# Patient Record
Sex: Male | Born: 1980 | Race: White | Hispanic: No | Marital: Single | Smoking: Never smoker
Health system: Southern US, Community
[De-identification: ages and names within clinical notes are randomized; demographics above are authoritative.]

## PROBLEM LIST (undated history)

## (undated) HISTORY — PX: APPENDECTOMY: SHX54

## (undated) HISTORY — PX: DG ARTHRO THUMB*R*: HXRAD209

---

## 2015-09-16 ENCOUNTER — Ambulatory Visit
Admission: EM | Admit: 2015-09-16 | Discharge: 2015-09-16 | Disposition: A | Discharge status: Home / Self Care | Payer: Self-pay | Attending: Family Medicine | Admitting: Family Medicine

## 2015-09-16 DIAGNOSIS — Z0289 Encounter for other administrative examinations: Secondary | ICD-10-CM

## 2015-09-16 LAB — DEPT OF TRANSP DIPSTICK, URINE (ARMC ONLY)
Glucose, UA: NEGATIVE mg/dL
HGB URINE DIPSTICK: NEGATIVE
Protein, ur: NEGATIVE mg/dL
SPECIFIC GRAVITY, URINE: 1.015 (ref 1.005–1.030)

## 2015-09-16 NOTE — ED Provider Notes (Signed)
[facility]    CSN: [id number] Arrival date & time: 09/16/15  1036  First Provider Contact:  None       History   Chief Complaint Chief Complaint  Patient presents with  . Commercial Driver's License Exam    HPI Russell Lopez is a 35 y.o. male.    Patient here for DOT Physical (see scanned form)   The history is provided by the patient.    History reviewed. No pertinent past medical history.  There are no active problems to display for this patient.   Past Surgical History:  Procedure Laterality Date  . APPENDECTOMY    . DG ARTHRO THUMB*R*         Home Medications    Prior to Admission medications   Not on File    Family History Family History  Problem Relation Age of Onset  . Diabetes Father   . Kidney failure Father   . Diabetes Paternal Grandmother     Social History Social History  Substance Use Topics  . Smoking status: Never Smoker  . Smokeless tobacco: Current User    Types: Chew  . Alcohol use Yes     Comment: minimal     Allergies   Review of patient's allergies indicates no known allergies.   Review of Systems Review of Systems   Physical Exam Triage Vital Signs ED Triage Vitals  Enc Vitals Group     BP 09/16/15 1125 121/66     Pulse Rate 09/16/15 1125 74     Resp 09/16/15 1125 17     Temp 09/16/15 1125 98.2 F (36.8 C)     Temp Source 09/16/15 1125 Oral     SpO2 09/16/15 1125 96 %     Weight 09/16/15 1125 (!) 332 lb (150.6 kg)     Height 09/16/15 1125  (1.727 m)     Head Circumference --      Peak Flow --      Pain Score 09/16/15 1123 0     Pain Loc --      Pain Edu? --      Excl. in GC? --    No data found.   Updated Vital Signs BP 121/66 (BP Location: Left Arm)   Pulse 74   Temp 98.2 F (36.8 C) (Oral)   Resp 17   Ht  (1.727 m)   Wt (!) 332 lb (150.6 kg)   SpO2 96%   BMI 50.48 kg/m   Visual Acuity Right Eye Distance: 20/25 Left Eye Distance: 20/25 Bilateral  Distance: 20/20  Right Eye Near:   Left Eye Near:    Bilateral Near:     Physical Exam   UC Treatments / Results  Labs (all labs ordered are listed, but only abnormal results are displayed) Labs Reviewed  DEPT OF TRANSP DIPSTICK, URINE([facility] ONLY)    EKG  EKG Interpretation None       Radiology No results found.  Procedures Procedures (including critical care time)  Medications Ordered in UC Medications - No data to display   Initial Impression / Assessment and Plan / UC Course  I have reviewed the triage vital signs and the nursing notes.  Pertinent labs & imaging results that were available during my care of the patient were reviewed by me and considered in my medical decision making (see chart for details).  Clinical Course      Final Clinical Impressions(s) / UC Diagnoses   Final diagnoses:  Encounter for  examination required by Department of Transportation (DOT)    New Prescriptions There are no discharge medications for this patient.   DOT Physical (medically qualified for 2  year; see scanned form)   Payton Mccallum, MD 09/16/15 1327

## 2015-09-16 NOTE — ED Triage Notes (Signed)
Patient is here for DOT physical and verbalizes no other complaints today.

## 2017-05-07 ENCOUNTER — Ambulatory Visit (INDEPENDENT_AMBULATORY_CARE_PROVIDER_SITE_OTHER): Payer: BLUE CROSS/BLUE SHIELD

## 2017-05-07 ENCOUNTER — Ambulatory Visit
Admission: EM | Admit: 2017-05-07 | Discharge: 2017-05-07 | Disposition: A | Discharge status: Home / Self Care | Payer: BLUE CROSS/BLUE SHIELD | Attending: Family Medicine | Admitting: Family Medicine

## 2017-05-07 ENCOUNTER — Encounter: Payer: Self-pay | Admitting: *Deleted

## 2017-05-07 DIAGNOSIS — R1013 Epigastric pain: Secondary | ICD-10-CM | POA: Diagnosis not present

## 2017-05-07 DIAGNOSIS — K8050 Calculus of bile duct without cholangitis or cholecystitis without obstruction: Secondary | ICD-10-CM | POA: Diagnosis not present

## 2017-05-07 DIAGNOSIS — R1011 Right upper quadrant pain: Secondary | ICD-10-CM

## 2017-05-07 LAB — COMPREHENSIVE METABOLIC PANEL
ALK PHOS: 63 U/L (ref 38–126)
ALT: 45 U/L (ref 17–63)
AST: 29 U/L (ref 15–41)
Albumin: 4.3 g/dL (ref 3.5–5.0)
Anion gap: 9 (ref 5–15)
BUN: 10 mg/dL (ref 6–20)
CALCIUM: 9.1 mg/dL (ref 8.9–10.3)
CHLORIDE: 103 mmol/L (ref 101–111)
CO2: 26 mmol/L (ref 22–32)
CREATININE: 0.88 mg/dL (ref 0.61–1.24)
GFR calc non Af Amer: >60 mL/min (ref >60)
GLUCOSE: 96 mg/dL (ref 65–99)
Potassium: 4.4 mmol/L (ref 3.5–5.1)
SODIUM: 138 mmol/L (ref 135–145)
Total Bilirubin: 0.6 mg/dL (ref 0.3–1.2)
Total Protein: 7.5 g/dL (ref 6.5–8.1)

## 2017-05-07 LAB — LIPASE, BLOOD: Lipase: 20 U/L (ref 11–51)

## 2017-05-07 NOTE — ED Triage Notes (Signed)
Diffuse abd pain with nausea following meals x3 weeks.

## 2017-05-07 NOTE — ED Provider Notes (Signed)
[facility]    CSN: [id number] Arrival date & time: 05/07/17  1659     History   Chief Complaint Chief Complaint  Patient presents with  . Abdominal Pain  . Nausea    HPI Russell Lopez is a 37 y.o. male.   The history is provided by the patient.  Abdominal Pain  Pain location:  Epigastric and RUQ Pain quality: aching   Pain radiates to:  Does not radiate Pain severity:  Moderate Onset quality:  Sudden Duration:  3 weeks Timing:  Intermittent Progression:  Waxing and waning Chronicity:  New Context: eating (after eating, especially fatty meals)   Relieved by:  None tried Ineffective treatments:  None tried Associated symptoms: no anorexia, no belching, no chest pain, no chills, no constipation, no cough, no diarrhea, no dysuria, no fatigue, no fever, no flatus, no hematemesis, no hematochezia, no hematuria, no melena, no nausea, no shortness of breath, no sore throat, no vaginal bleeding, no vaginal discharge and no vomiting   Risk factors: obesity   Risk factors: no alcohol abuse     History reviewed. No pertinent past medical history.  There are no active problems to display for this patient.   Past Surgical History:  Procedure Laterality Date  . APPENDECTOMY    . DG ARTHRO THUMB*R*         Home Medications    Prior to Admission medications   Not on File    Family History Family History  Problem Relation Age of Onset  . Healthy Mother   . Diabetes Father   . Kidney failure Father   . Diabetes Paternal Grandmother     Social History Social History   Tobacco Use  . Smoking status: Never Smoker  . Smokeless tobacco: Current User    Types: Chew  Substance Use Topics  . Alcohol use: Yes    Comment: minimal  . Drug use: No     Allergies   Patient has no known allergies.   Review of Systems Review of Systems  Constitutional: Negative for chills, fatigue and fever.  HENT: Negative for sore throat.   Respiratory: Negative  for cough and shortness of breath.   Cardiovascular: Negative for chest pain.  Gastrointestinal: Positive for abdominal pain. Negative for anorexia, constipation, diarrhea, flatus, hematemesis, hematochezia, melena, nausea and vomiting.  Genitourinary: Negative for dysuria, hematuria, vaginal bleeding and vaginal discharge.     Physical Exam Triage Vital Signs ED Triage Vitals  Enc Vitals Group     BP 05/07/17 1804 125/75     Pulse Rate 05/07/17 1804 (!) 57     Resp 05/07/17 1804 16     Temp 05/07/17 1804 98.8 F (37.1 C)     Temp Source 05/07/17 1804 Oral     SpO2 05/07/17 1804 99 %     Weight 05/07/17 1806 300 lb (136.1 kg)     Height 05/07/17 1806 5\' 9"  (1.753 m)     Head Circumference --      Peak Flow --      Pain Score 05/07/17 1806 7     Pain Loc --      Pain Edu? --      Excl. in GC? --    No data found.  Updated Vital Signs BP 125/75 (BP Location: Left Arm)   Pulse (!) 57   Temp 98.8 F (37.1 C) (Oral)   Resp 16   Ht 5\' 9"  (1.753 m)   Wt 300 lb (136.1 kg)  SpO2 99%   BMI 44.30 kg/m   Visual Acuity Right Eye Distance:   Left Eye Distance:   Bilateral Distance:    Right Eye Near:   Left Eye Near:    Bilateral Near:     Physical Exam  Constitutional: He is oriented to person, place, and time. He appears well-developed and well-nourished. No distress.  HENT:  Head: Normocephalic and atraumatic.  Cardiovascular: Normal rate, regular rhythm, normal heart sounds and intact distal pulses.  No murmur heard. Pulmonary/Chest: Effort normal and breath sounds normal. No respiratory distress. He has no wheezes. He has no rales.  Abdominal: Soft. Bowel sounds are normal. He exhibits no distension and no mass. There is no tenderness. There is no rebound and no guarding.  Neurological: He is alert and oriented to person, place, and time.  Skin: No rash noted. He is not diaphoretic.  Nursing note and vitals reviewed.    UC Treatments / Results  Labs (all labs  ordered are listed, but only abnormal results are displayed) Labs Reviewed  COMPREHENSIVE METABOLIC PANEL  LIPASE, BLOOD    EKG None Radiology Dg Abd 2 Views  Result Date: 05/07/2017 CLINICAL DATA:  Diffuse intermittent abdominal pain with nausea EXAM: ABDOMEN - 2 VIEW COMPARISON:  None. FINDINGS: Nonobstructed gas pattern. No free air beneath the diaphragm. Nonobstructed gas pattern with moderate stool in the colon. Overall diffuse decreased bowel gas. IMPRESSION: Nonspecific diffuse decreased bowel gas with scattered colon gas present. No definite obstructive pattern identified. Electronically Signed   By: Jasmine PangKim  Fujinaga M.D.   On: 05/07/2017 19:20    Procedures Procedures (including critical care time)  Medications Ordered in UC Medications - No data to display   Initial Impression / Assessment and Plan / UC Course  I have reviewed the triage vital signs and the nursing notes.  Pertinent labs & imaging results that were available during my care of the patient were reviewed by me and considered in my medical decision making (see chart for details).       Final Clinical Impressions(s) / UC Diagnoses   Final diagnoses:  Biliary colic    ED Discharge Orders    None     1. Labs/x-ray results and diagnosis reviewed with patient 2. Recommend supportive treatment with avoidance of fatty foods 3. Follow up with PCP for further evaluation/referrals 4. Follow-up prn if symptoms worsen or don't improve  Controlled Substance Prescriptions [place] Controlled Substance Registry consulted? Not Applicable   Payton Mccallumonty, Mandie Crabbe, MD 05/07/17 1949

## 2019-03-16 IMAGING — CR DG ABDOMEN 2V
4 series · 4 of 4 positions shown · non-contrast
Comparison: None.

CLINICAL DATA: Diffuse intermittent abdominal pain with nausea

EXAM:
ABDOMEN - 2 VIEW

[abdomen erect (1 of 2)]
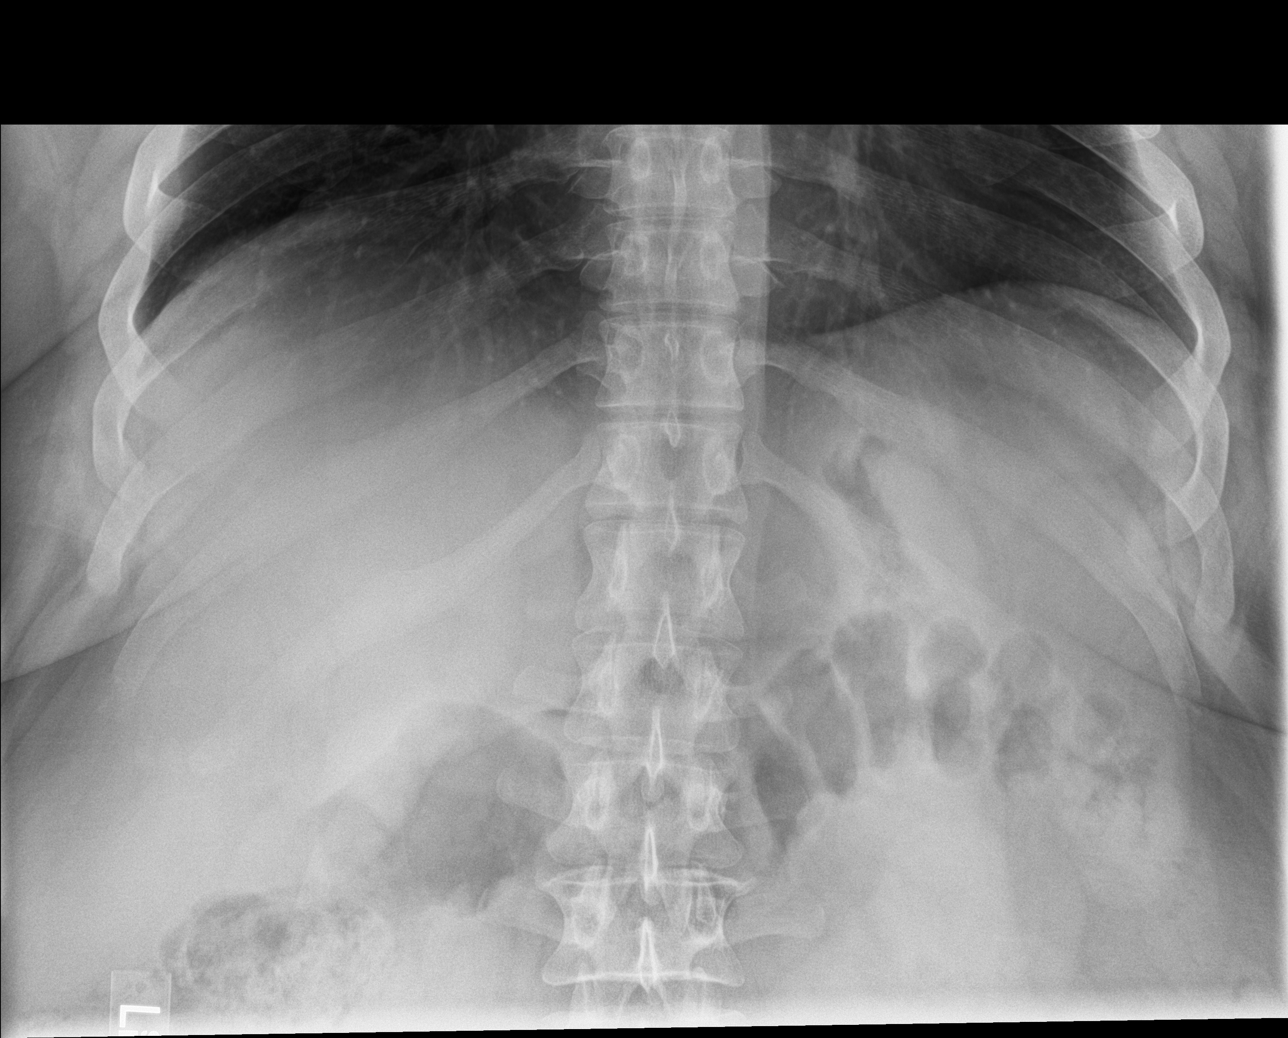

[abdomen erect (2 of 2)]
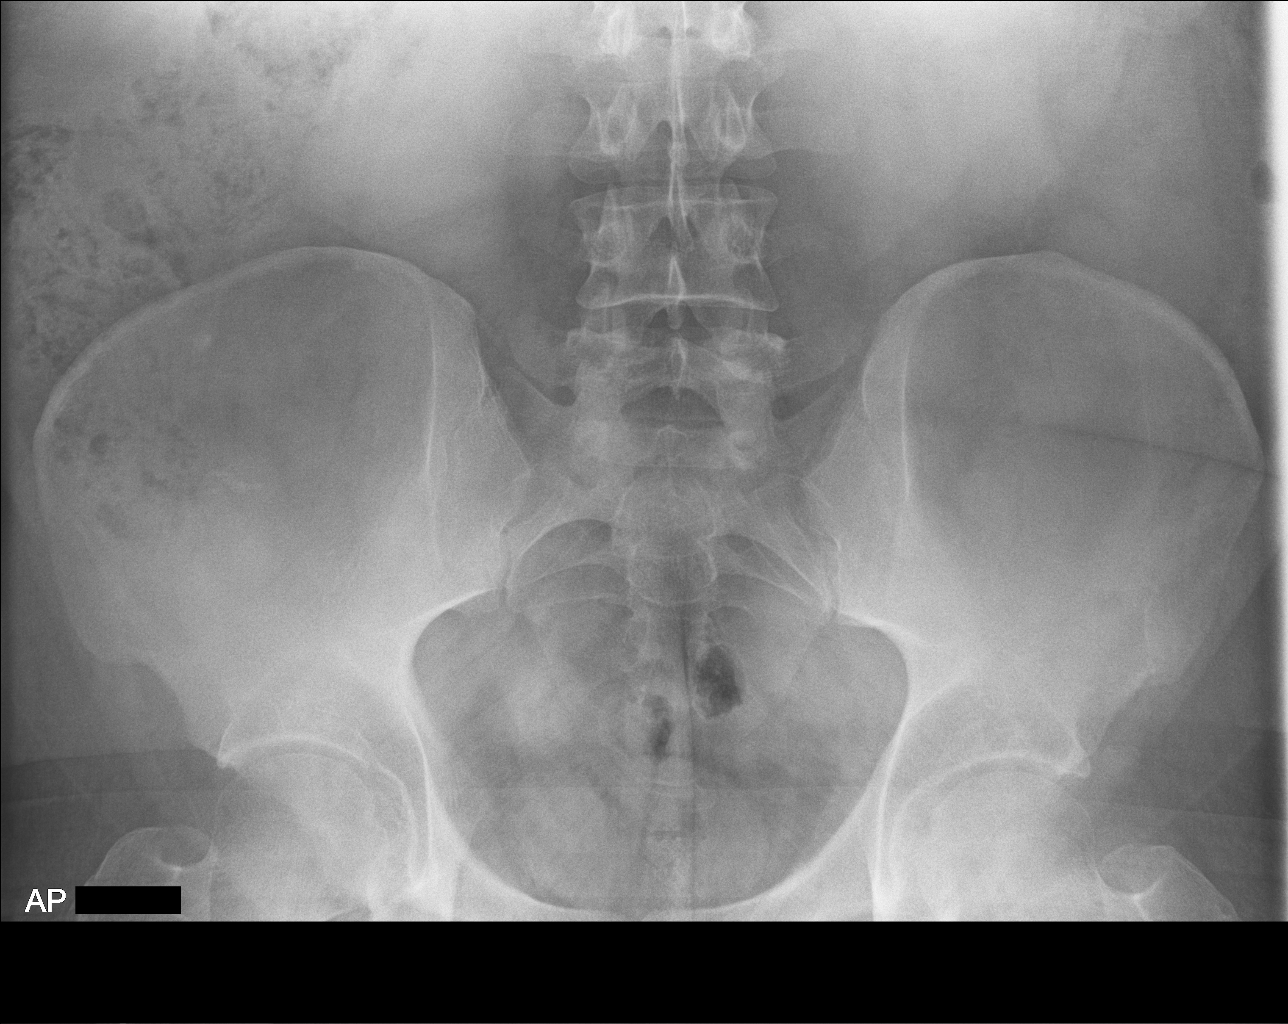

[abdomen supine (1 of 2)]
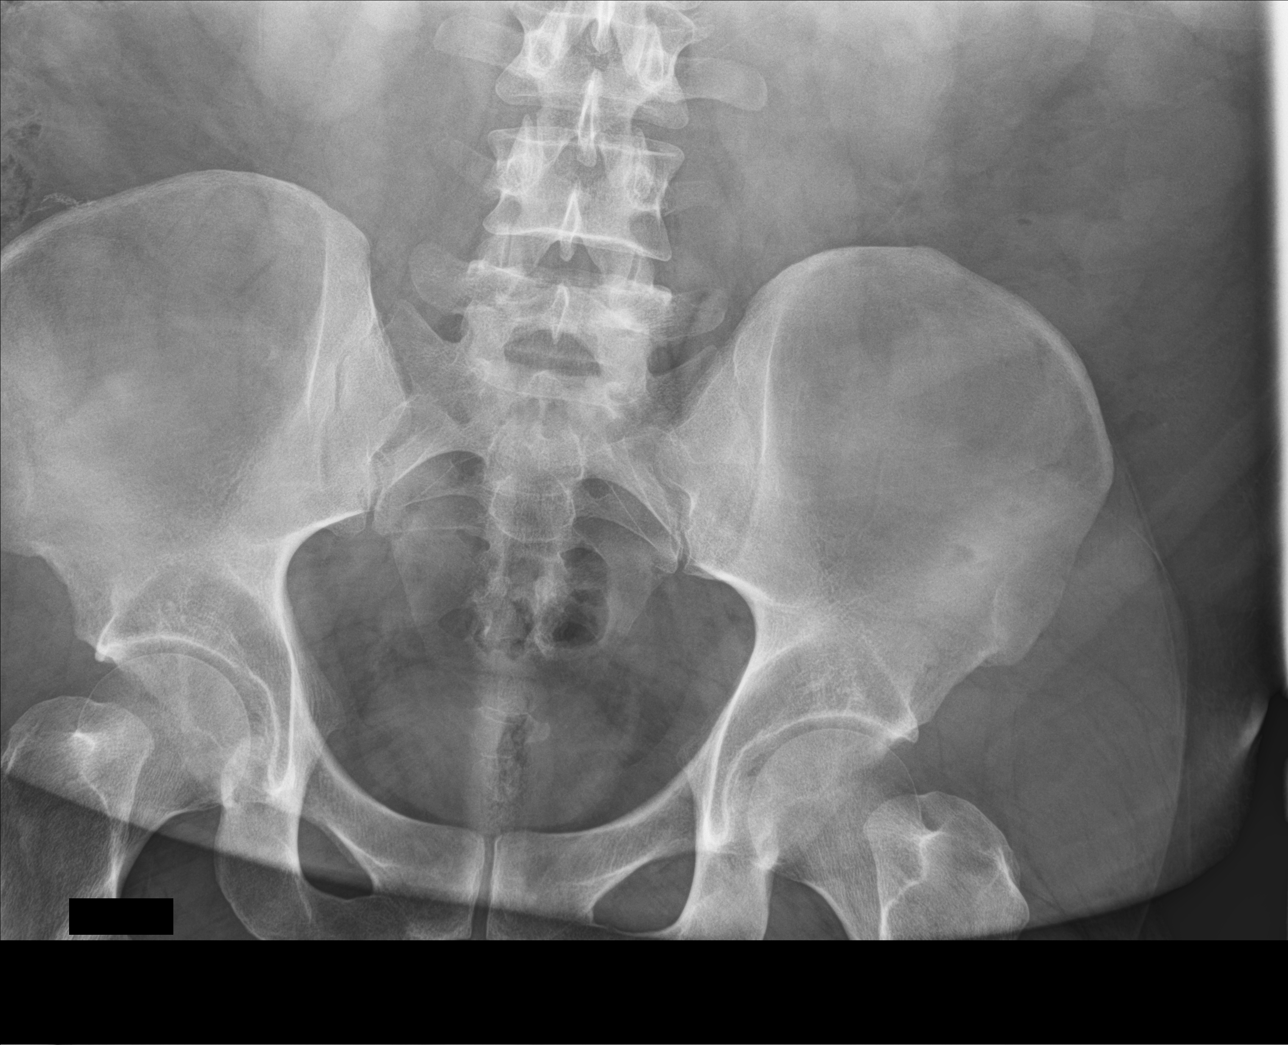

[abdomen supine (2 of 2)]
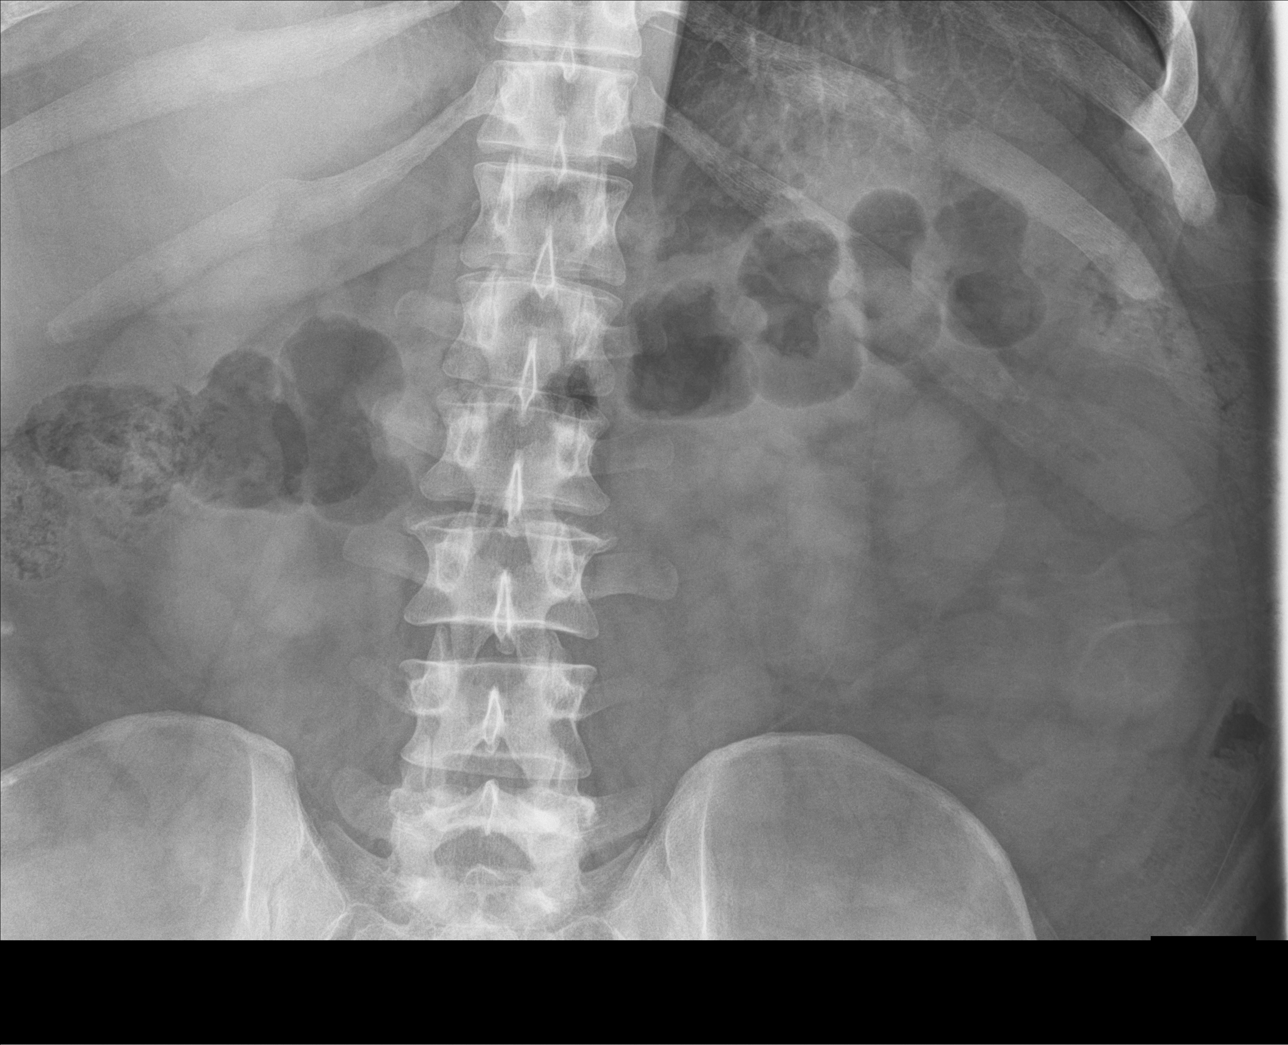

[4 of 4 positions shown; findings below may reference images not displayed]

FINDINGS: Nonobstructed gas pattern. No free air beneath the diaphragm.
Nonobstructed gas pattern with moderate stool in the colon. Overall
diffuse decreased bowel gas.
IMPRESSION: Nonspecific diffuse decreased bowel gas with scattered colon gas
present. No definite obstructive pattern identified.

## 2019-11-20 ENCOUNTER — Ambulatory Visit: Payer: Self-pay
